# Patient Record
Sex: Female | Born: 1955 | Race: White | Hispanic: No | Marital: Married | Smoking: Never smoker
Health system: Southern US, Community
[De-identification: ages and names within clinical notes are randomized; demographics above are authoritative.]

## PROBLEM LIST (undated history)

## (undated) DIAGNOSIS — C801 Malignant (primary) neoplasm, unspecified: Secondary | ICD-10-CM

## (undated) HISTORY — PX: BREAST EXCISIONAL BIOPSY: SUR124

## (undated) HISTORY — PX: BREAST BIOPSY: SHX20

---

## 1999-01-30 ENCOUNTER — Other Ambulatory Visit: Admission: RE | Admit: 1999-01-30 | Discharge: 1999-01-30 | Payer: Self-pay | Admitting: Family Medicine

## 1999-12-26 ENCOUNTER — Encounter: Admission: RE | Admit: 1999-12-26 | Discharge: 1999-12-26 | Payer: Self-pay | Admitting: Family Medicine

## 1999-12-26 ENCOUNTER — Encounter: Payer: Self-pay | Admitting: Family Medicine

## 2000-02-27 ENCOUNTER — Other Ambulatory Visit: Admission: RE | Admit: 2000-02-27 | Discharge: 2000-02-27 | Payer: Self-pay | Admitting: Family Medicine

## 2005-10-23 ENCOUNTER — Ambulatory Visit: Payer: Self-pay | Admitting: Family Medicine

## 2005-11-13 ENCOUNTER — Ambulatory Visit: Payer: Self-pay | Admitting: Family Medicine

## 2005-12-09 DIAGNOSIS — E0500 Thyrotoxicosis with diffuse goiter without thyrotoxic crisis or storm: Secondary | ICD-10-CM | POA: Insufficient documentation

## 2006-09-05 ENCOUNTER — Ambulatory Visit: Payer: Self-pay | Admitting: Family Medicine

## 2007-10-29 ENCOUNTER — Ambulatory Visit: Payer: Self-pay | Admitting: Family Medicine

## 2009-05-31 ENCOUNTER — Ambulatory Visit: Payer: Self-pay

## 2010-01-01 ENCOUNTER — Ambulatory Visit: Payer: Self-pay | Admitting: Family Medicine

## 2010-01-15 ENCOUNTER — Ambulatory Visit: Payer: Self-pay | Admitting: Family Medicine

## 2013-05-26 ENCOUNTER — Ambulatory Visit: Payer: Self-pay | Admitting: Family Medicine

## 2013-05-28 ENCOUNTER — Ambulatory Visit: Payer: Self-pay | Admitting: Family Medicine

## 2014-06-03 ENCOUNTER — Ambulatory Visit: Payer: Self-pay | Admitting: Family Medicine

## 2014-07-01 ENCOUNTER — Ambulatory Visit: Payer: Self-pay | Admitting: Family Medicine

## 2014-07-07 ENCOUNTER — Ambulatory Visit: Payer: Self-pay | Admitting: Family Medicine

## 2014-07-28 ENCOUNTER — Ambulatory Visit: Payer: Self-pay | Admitting: Family Medicine

## 2014-09-16 ENCOUNTER — Ambulatory Visit: Payer: Self-pay | Admitting: Family Medicine

## 2014-10-21 ENCOUNTER — Ambulatory Visit: Payer: Self-pay | Admitting: Family Medicine

## 2014-12-16 ENCOUNTER — Ambulatory Visit: Payer: Self-pay | Admitting: Family Medicine

## 2014-12-23 LAB — CBC AND DIFFERENTIAL
HCT: 40 % (ref 36–46)
Hemoglobin: 13.8 g/dL (ref 12.0–16.0)
Neutrophils Absolute: 2 /uL
Platelets: 276 10*3/uL (ref 150–399)
WBC: 4.3 10^3/mL

## 2014-12-23 LAB — BASIC METABOLIC PANEL
BUN: 24 mg/dL — AB (ref 4–21)
Creatinine: 0.9 mg/dL (ref 0.5–1.1)
Glucose: 94 mg/dL
Potassium: 4.1 mmol/L (ref 3.4–5.3)
Sodium: 142 mmol/L (ref 137–147)

## 2014-12-23 LAB — HEPATIC FUNCTION PANEL
ALT: 11 U/L (ref 7–35)
AST: 15 U/L (ref 13–35)
Alkaline Phosphatase: 86 U/L (ref 25–125)
Bilirubin, Total: 0.5 mg/dL

## 2014-12-23 LAB — LIPID PANEL
Cholesterol: 198 mg/dL (ref 0–200)
HDL: 78 mg/dL — AB (ref 35–70)
LDL Cholesterol: 111 mg/dL
LDl/HDL Ratio: 1.4
Triglycerides: 47 mg/dL (ref 40–160)

## 2014-12-23 LAB — TSH: TSH: 0.9 u[IU]/mL (ref 0.41–5.90)

## 2015-01-20 ENCOUNTER — Ambulatory Visit: Payer: Self-pay | Admitting: Family Medicine

## 2015-01-27 ENCOUNTER — Ambulatory Visit: Payer: Self-pay | Admitting: Family Medicine

## 2015-03-24 ENCOUNTER — Ambulatory Visit
Admit: 2015-03-24 | Disposition: A | Discharge status: Home / Self Care | Payer: Self-pay | Attending: Family Medicine | Admitting: Family Medicine

## 2015-03-24 LAB — HM PAP SMEAR

## 2015-05-19 DIAGNOSIS — F4320 Adjustment disorder, unspecified: Secondary | ICD-10-CM | POA: Insufficient documentation

## 2015-05-19 DIAGNOSIS — Z124 Encounter for screening for malignant neoplasm of cervix: Secondary | ICD-10-CM | POA: Insufficient documentation

## 2015-05-19 DIAGNOSIS — J189 Pneumonia, unspecified organism: Secondary | ICD-10-CM | POA: Insufficient documentation

## 2015-05-19 DIAGNOSIS — Z6822 Body mass index (BMI) 22.0-22.9, adult: Secondary | ICD-10-CM | POA: Insufficient documentation

## 2015-05-19 DIAGNOSIS — IMO0002 Reserved for concepts with insufficient information to code with codable children: Secondary | ICD-10-CM | POA: Insufficient documentation

## 2015-05-22 ENCOUNTER — Encounter: Payer: Self-pay | Admitting: Family Medicine

## 2015-05-22 ENCOUNTER — Ambulatory Visit (INDEPENDENT_AMBULATORY_CARE_PROVIDER_SITE_OTHER): Payer: BLUE CROSS/BLUE SHIELD | Admitting: Family Medicine

## 2015-05-22 VITALS — BP 118/70 | HR 80 | Temp 97.7°F | Resp 16 | Ht 63.0 in | Wt 133.0 lb

## 2015-05-22 DIAGNOSIS — R938 Abnormal findings on diagnostic imaging of other specified body structures: Secondary | ICD-10-CM

## 2015-05-22 DIAGNOSIS — R9389 Abnormal findings on diagnostic imaging of other specified body structures: Secondary | ICD-10-CM

## 2015-05-22 DIAGNOSIS — M7522 Bicipital tendinitis, left shoulder: Secondary | ICD-10-CM | POA: Diagnosis not present

## 2015-05-22 DIAGNOSIS — J189 Pneumonia, unspecified organism: Secondary | ICD-10-CM

## 2015-05-22 MED ORDER — MELOXICAM 15 MG PO TABS: 15.0000 mg | ORAL_TABLET | Freq: Every day | ORAL | Status: DC

## 2015-05-22 NOTE — Progress Notes (Signed)
   Subjective:    Patient ID: Bonnie Campbell, female    DOB: November 16, 1956, 59 y.o.   MRN: [id number]  Arm Pain  There was no injury mechanism. The pain is present in the upper right arm. The quality of the pain is described as aching (on and off; worse with movement). The pain does not radiate. The pain is mild. The pain has been improving since the incident.  Patient reports that today she feels much better. Patient reports that she took OTC Aleve, pt reports last dose Saturday night. Patient reports that pain was worse on Thursday and Friday.  Works as a Educational psychologist.   Was severe on Friday. Really was with specific movements. ROM no longer limited.     Review of Systems  Constitutional: Negative.   Respiratory: Negative.   Cardiovascular: Negative.   Musculoskeletal: Positive for myalgias.  Neurological: Negative for headaches.  Psychiatric/Behavioral: Negative.     History reviewed. No pertinent past medical history. Past Surgical History  Procedure Laterality Date  . Breast biopsy Bilateral     Multiple times    reports that she has never smoked. She has never used smokeless tobacco. She reports that she does not drink alcohol or use illicit drugs. family history includes Breast cancer in her paternal grandmother; Diabetes Mellitus II in her mother; Kidney cancer in her father; Lung cancer in her father; Prostate cancer in her father. Allergies  Allergen Reactions  . Augmentin  [Amoxicillin-Pot Clavulanate]   . Codeine   . Hydrocodone-Acetaminophen Nausea Only   Patient Active Problem List   Diagnosis Date Noted  . Adaptation reaction 05/19/2015  . Body mass index (BMI) of 22.0-22.9 in adult 05/19/2015  . Encounter for screening for malignant neoplasm of cervix 05/19/2015  . 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 PNA (pneumonia) 05/19/2015  . 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 Adaptation reaction 05/19/2015  . Body mass index (BMI) of 22.0-22.9 in adult 05/19/2015  . Encounter for screening for malignant neoplasm of cervix 05/19/2015  . 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 PNA (pneumonia) 05/19/2015  . 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 calcium carbonate (TUMS) 500 MG chewable tablet Chew 1 tablet by mouth as needed.    . Naproxen Sodium (ALEVE) 220 MG CAPS Take 1 tablet by mouth as needed.     No current facility-administered medications on file prior to visit.        Objective:   Physical Exam  Constitutional: She appears well-developed and well-nourished.  Cardiovascular: Normal rate and regular rhythm.   Pulmonary/Chest: Effort normal and breath sounds normal.  Musculoskeletal:  Tender in right bicep.     Blood pressure 118/70, pulse 80, temperature 97.7 F (36.5 C), temperature source Oral, resp. rate 16, height 5\' 3"  (1.6 m), weight 133 lb (60.328 kg).       Assessment & Plan:   1. Biceps tendonitis on left Improving. Mobic as needed. Please call back if condition worsens or does not continue to improve.     2. Pneumonia, organism unspecified  Recheck CXR.  - DG Chest 2 View; Future  3. Abnormal CXR  Stable.

## 2015-05-26 ENCOUNTER — Ambulatory Visit
Admission: RE | Admit: 2015-05-26 | Discharge: 2015-05-26 | Disposition: A | Discharge status: Home / Self Care | Payer: BLUE CROSS/BLUE SHIELD | Source: Ambulatory Visit | Attending: Family Medicine | Admitting: Family Medicine

## 2015-05-26 ENCOUNTER — Telehealth: Payer: Self-pay

## 2015-05-26 DIAGNOSIS — J189 Pneumonia, unspecified organism: Secondary | ICD-10-CM | POA: Insufficient documentation

## 2015-05-26 DIAGNOSIS — R938 Abnormal findings on diagnostic imaging of other specified body structures: Secondary | ICD-10-CM | POA: Insufficient documentation

## 2015-05-26 DIAGNOSIS — R9389 Abnormal findings on diagnostic imaging of other specified body structures: Secondary | ICD-10-CM

## 2015-05-26 NOTE — Telephone Encounter (Signed)
LMTCB 05/26/2015  Thanks,   -Mickel Baas

## 2015-05-26 NOTE — Telephone Encounter (Signed)
-----   Message from Margarita Rana, MD sent at 05/26/2015  2:02 PM EDT ----- Normal CXR. Please notify patient. Thanks.

## 2015-05-26 NOTE — Telephone Encounter (Signed)
Pt advised as directed below.   Thanks,   -Vernestine Brodhead  

## 2015-12-24 IMAGING — CT CT CHEST W/O CM
2 of 3 series · 15 of 36 positions shown, 18 images · non-contrast
Comparison: Chest radiographs 01/20/2015 and earlier.

CLINICAL DATA: 58-year-old female with recurrent pneumonia. Cough
and congestion worsening x2 weeks. Subsequent encounter.

EXAM:
CT CHEST WITHOUT CONTRAST
TECHNIQUE: Multidetector CT imaging of the chest was performed following the
standard protocol without IV contrast..

[Series 2: routine chest wo · axial · 0.63mm/px · z∈[-618,-354]mm · 12 of 63 slices shown, 15 images]
[im 5/63  mediastinal]
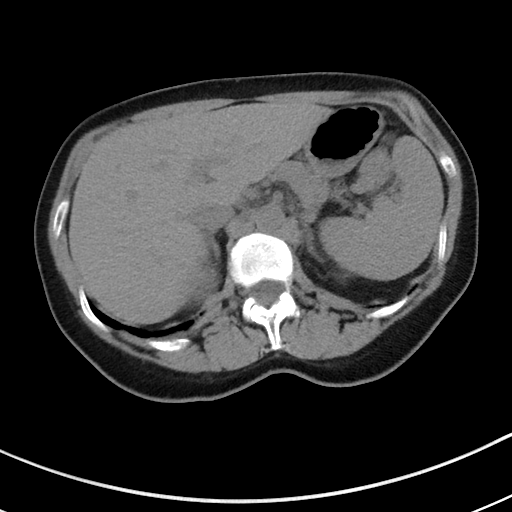
[im 5/63  lung]
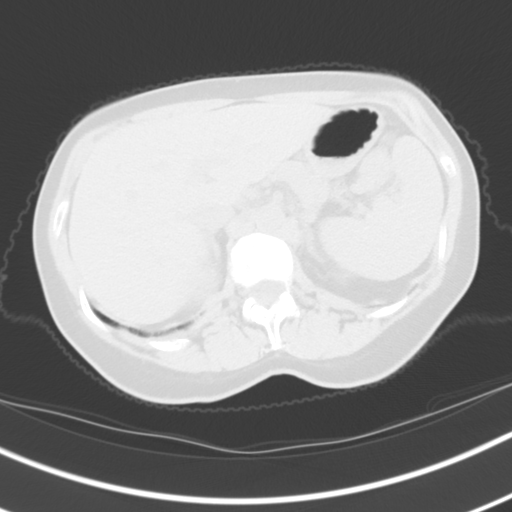
[im 10/63  lung]
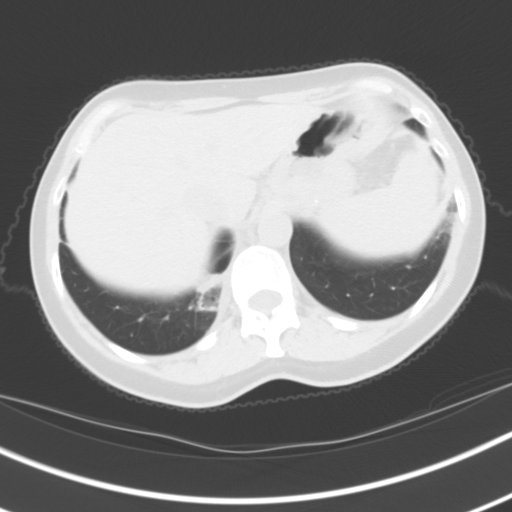
[im 14/63  lung]
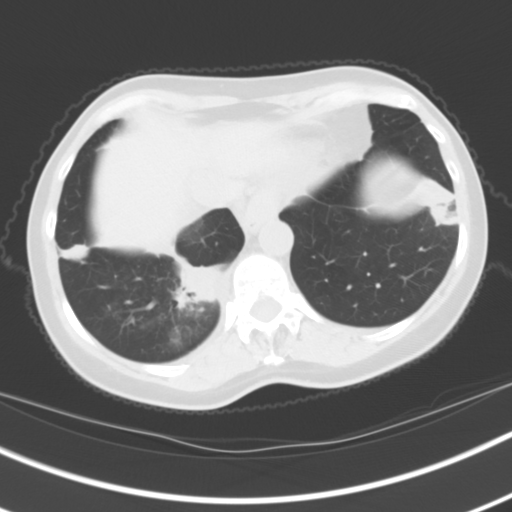
[im 19/63  lung]
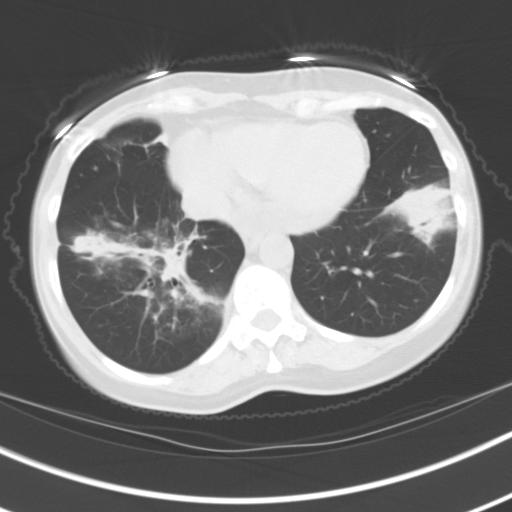
[im 23/63  mediastinal]
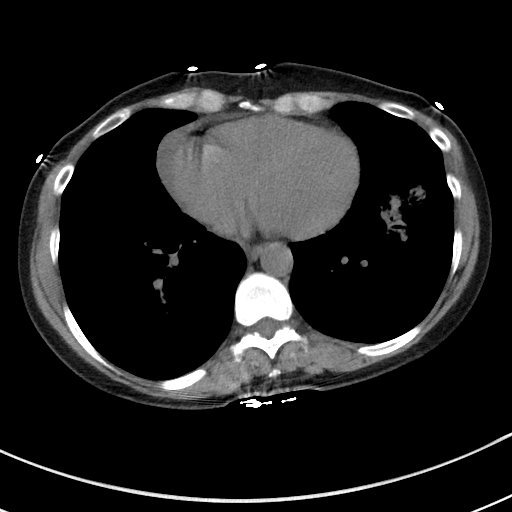
[im 23/63  lung]
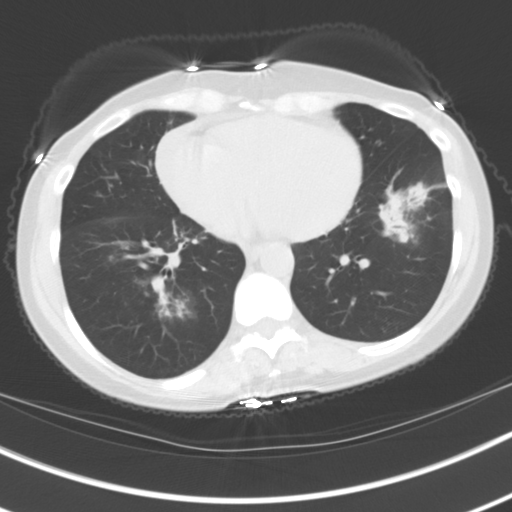
[im 28/63  lung]
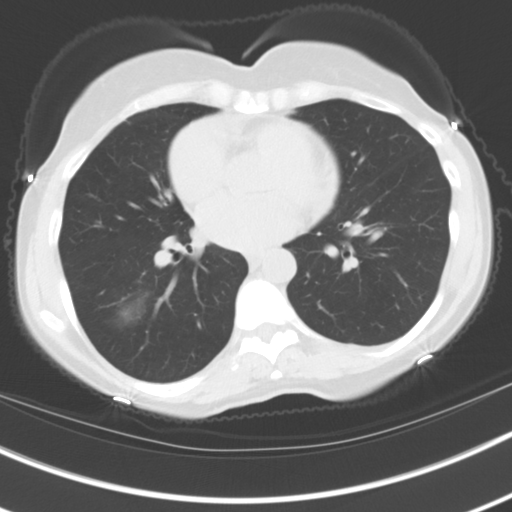
[im 35/63  lung]
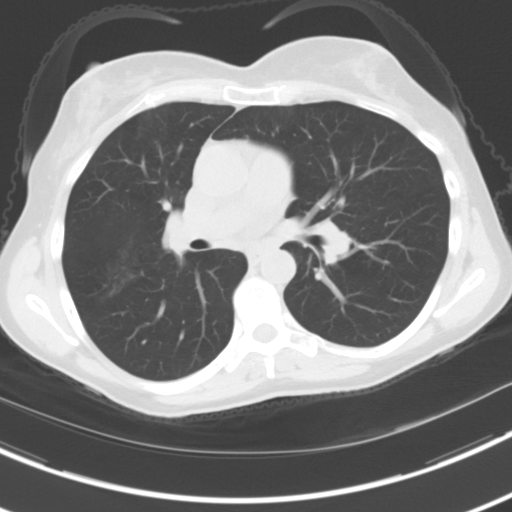
[im 40/63  lung]
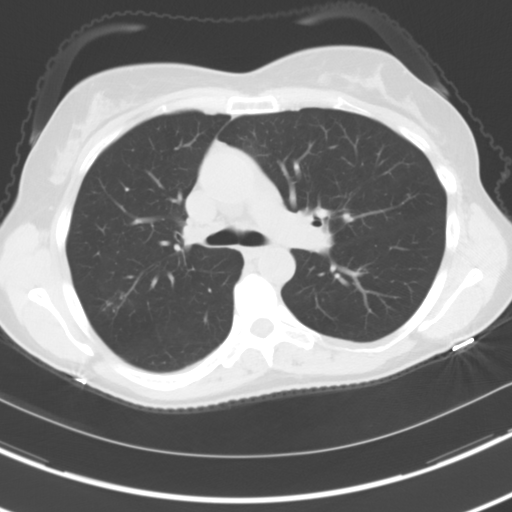
[im 44/63  mediastinal]
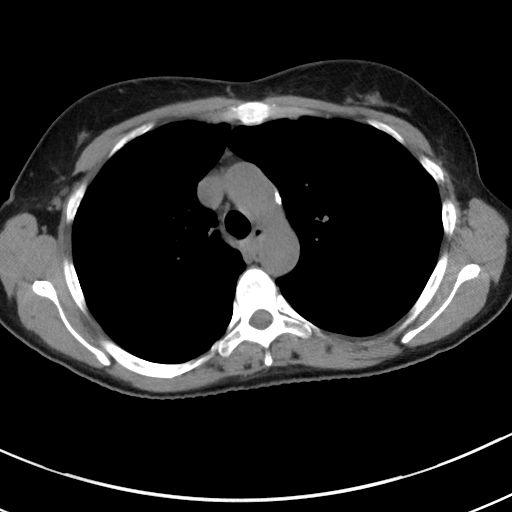
[im 44/63  lung]
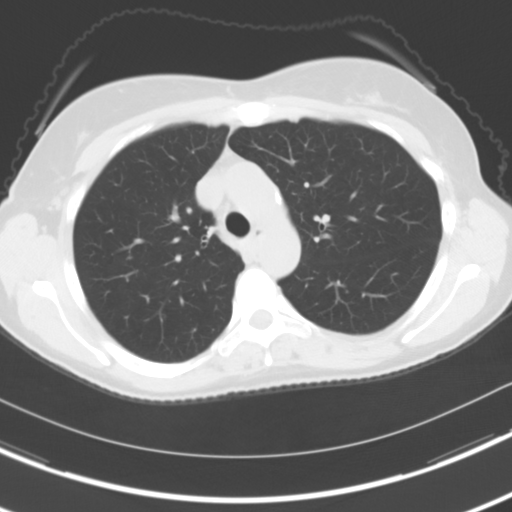
[im 49/63  lung]
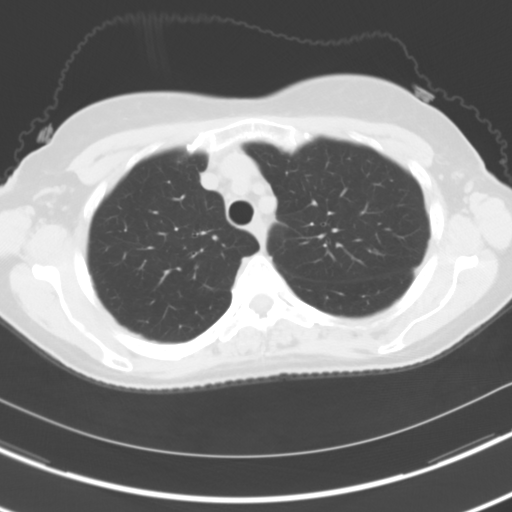
[im 53/63  lung]
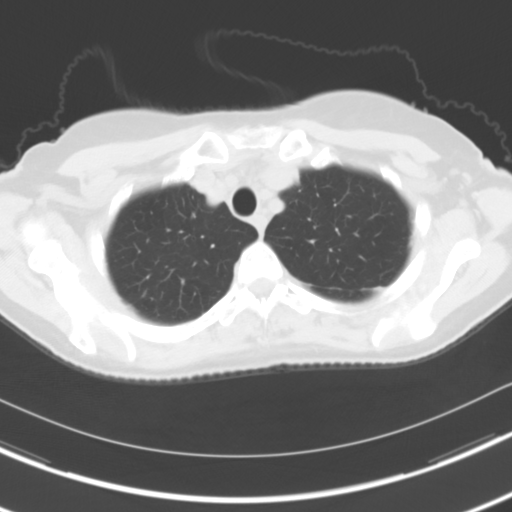
[im 58/63  lung]
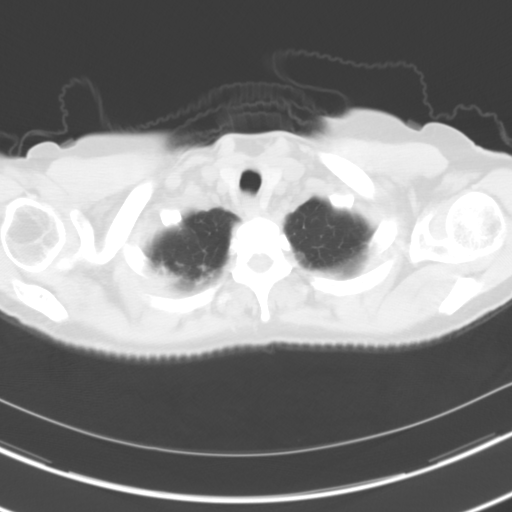

[Series 5: cor routine chest wo · coronal · 0.63mm/px · 3 of 121 slices shown]
[im 25/121  lung]
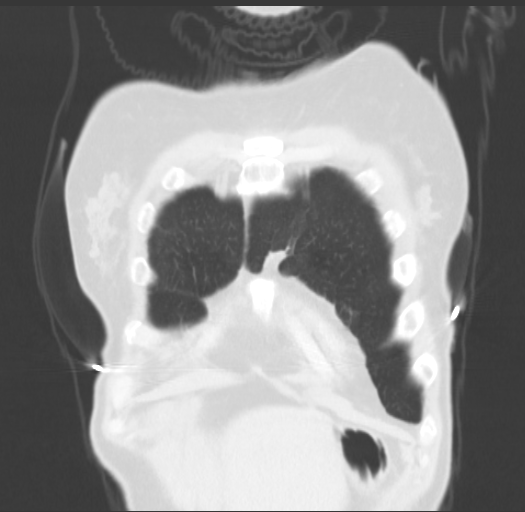
[im 49/121  lung]
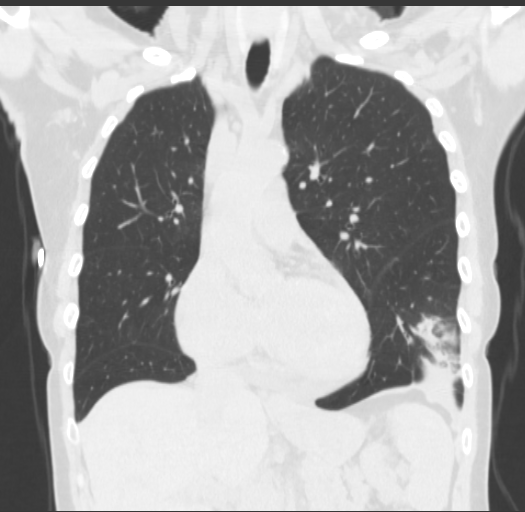
[im 73/121  lung]
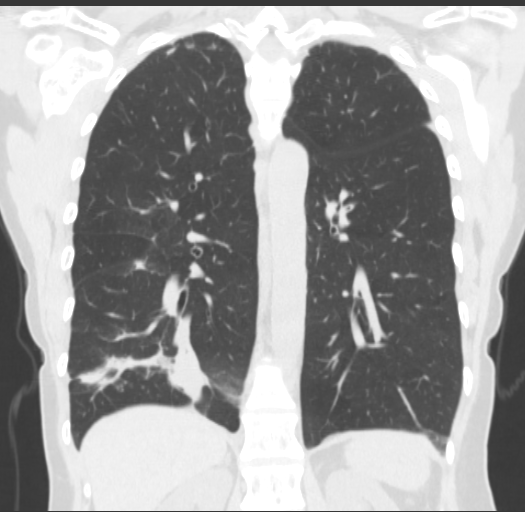

[15 of 36 positions shown; findings below may reference images not displayed]

FINDINGS: Central airways are patent. There are multifocal bilateral lower
lobe confluent areas of airspace disease and consolidation with air
bronchograms. Surrounding tree-in-bud nodularity in the right lower
lobe which is more extensively affected. Mild if any associated
lower lobe bronchiectasis. Early tree-in-bud nodularity in the
inferior aspect of the right upper lobe. similar changes also in the
medial segment of the right middle lobe. Minimal distal
peribronchovascular nodularity in the lingula. The left upper lobe
is spared.

Trace right pleural effusion. No pericardial effusion. No
mediastinal or hilar lymphadenopathy.

Negative non contrast thoracic inlet. No axillary lymphadenopathy.
Negative visualized non contrast liver, spleen, pancreas, adrenal
glands, kidneys, and bowel in the upper abdomen.

No acute osseous abnormality identified.
IMPRESSION: Bilateral multilobar bronchopneumonia, right lower lobe most
affected. Trace right pleural effusion.

Again, recommend post treatment radiographs to document resolution.

## 2016-04-21 IMAGING — CR DG CHEST 2V
1 series · 2 of 2 positions shown · non-contrast
Comparison: 03/24/2015.

CLINICAL DATA: Pneumonia.

EXAM:
CHEST  2 VIEW

[Series 1: dg chest 2 view · 0.14mm/px · 2 of 2 slices shown]
[im 1/2]
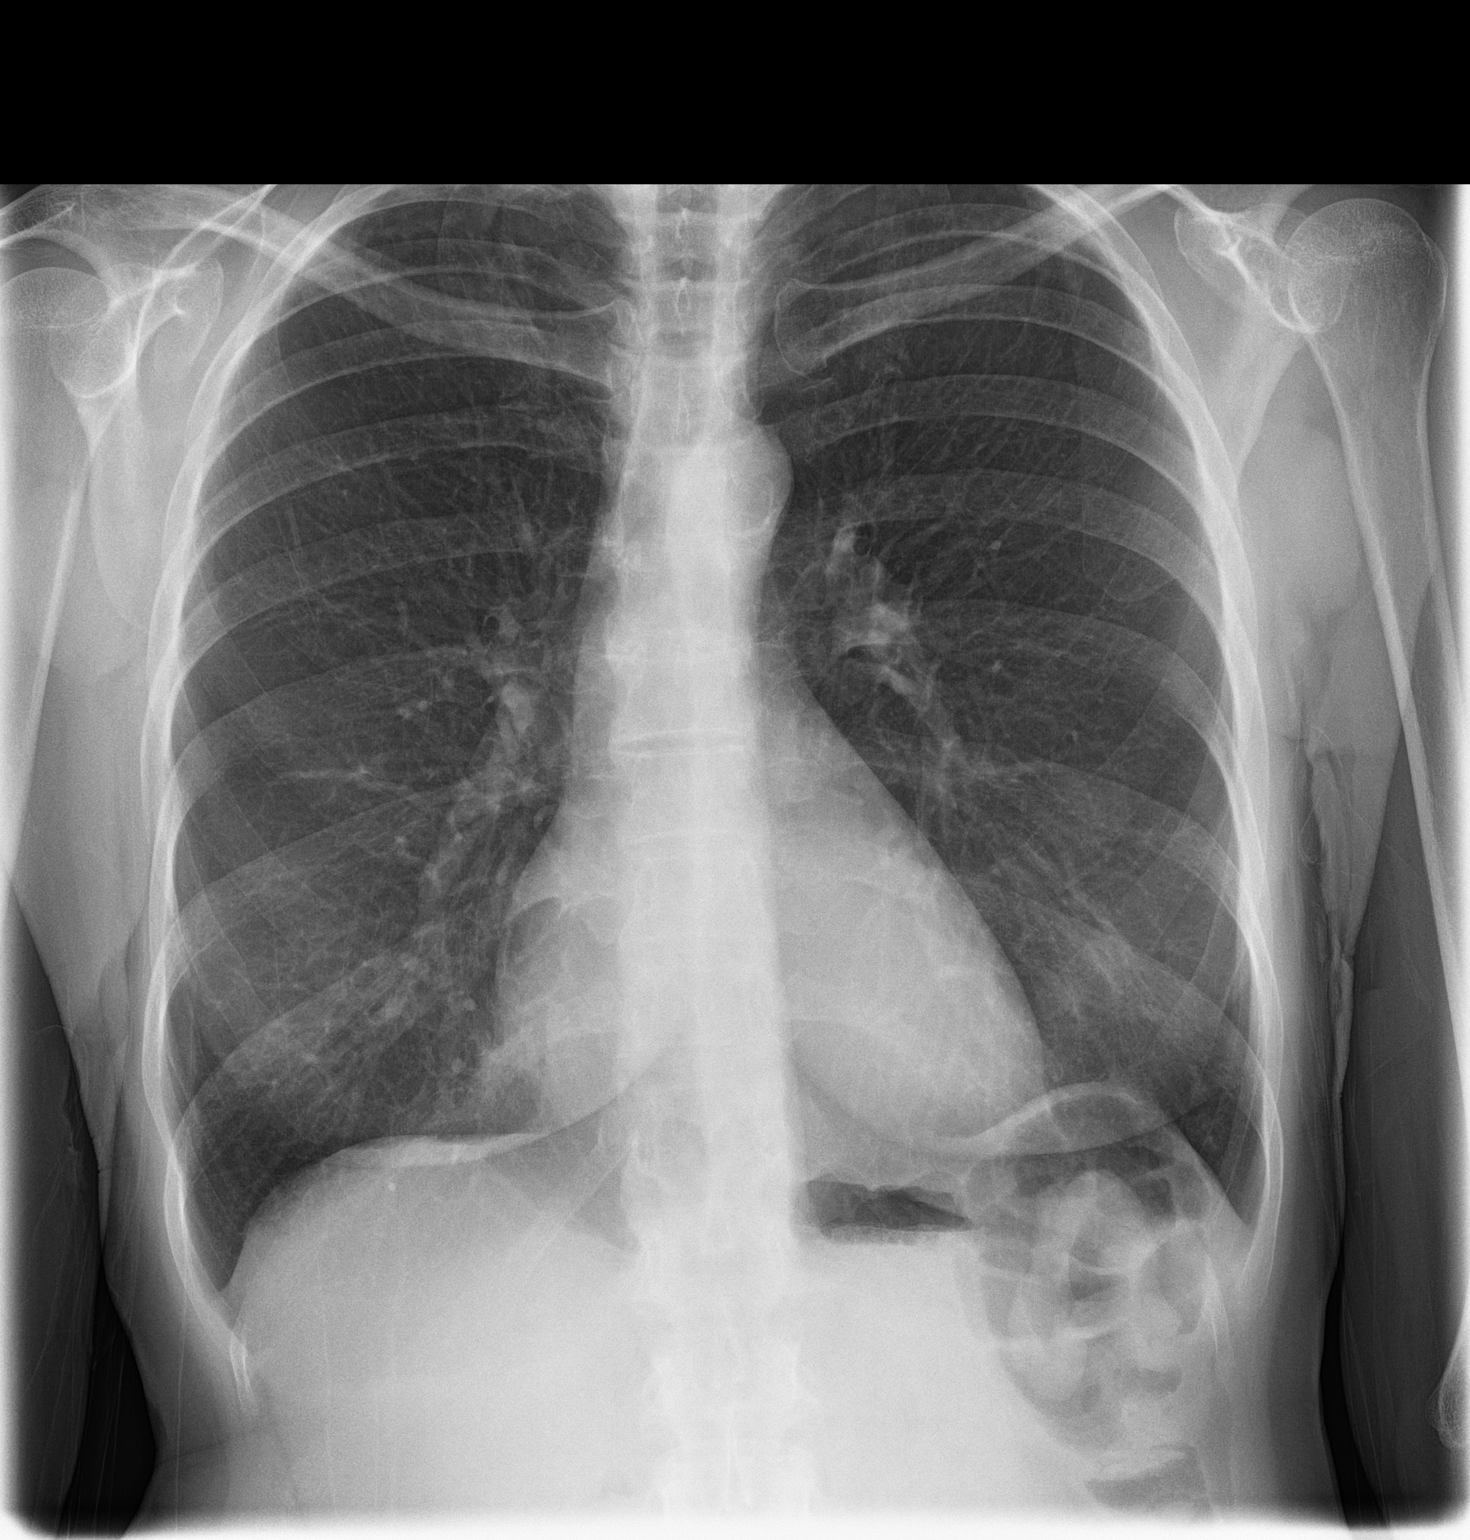
[im 2/2]
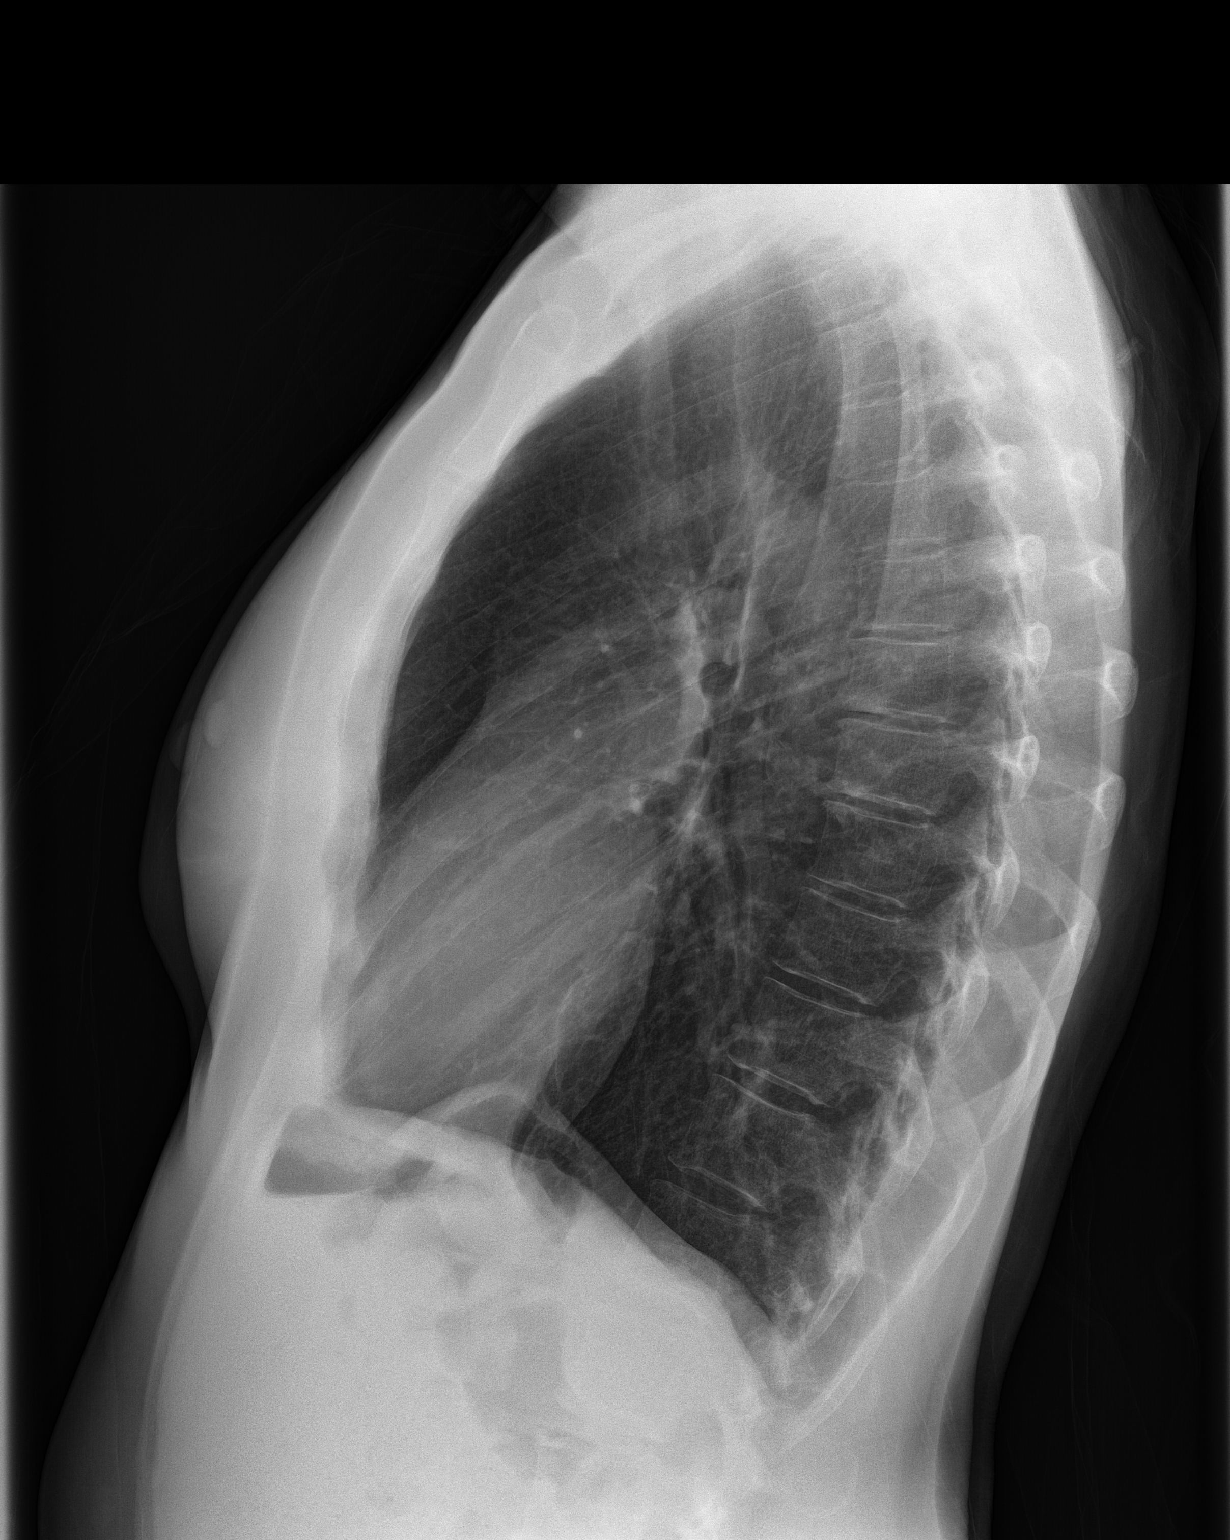

[2 of 2 positions shown; findings below may reference images not displayed]

FINDINGS: Mediastinum hilar structures are normal. No acute infiltrate. No
pleural effusion or pneumothorax. Biapical pleural parenchymal
thickening noted consistent scarring. Heart size normal. No acute
bony abnormality.
IMPRESSION: No acute cardiopulmonary disease.

## 2016-05-17 ENCOUNTER — Ambulatory Visit (INDEPENDENT_AMBULATORY_CARE_PROVIDER_SITE_OTHER): Payer: BLUE CROSS/BLUE SHIELD | Admitting: Family Medicine

## 2016-05-17 ENCOUNTER — Encounter: Payer: Self-pay | Admitting: Family Medicine

## 2016-05-17 VITALS — BP 110/70 | HR 76 | Temp 97.8°F | Resp 16 | Ht 62.5 in | Wt 134.0 lb

## 2016-05-17 DIAGNOSIS — B977 Papillomavirus as the cause of diseases classified elsewhere: Secondary | ICD-10-CM | POA: Diagnosis not present

## 2016-05-17 DIAGNOSIS — R8789 Other abnormal findings in specimens from female genital organs: Secondary | ICD-10-CM

## 2016-05-17 DIAGNOSIS — Z1239 Encounter for other screening for malignant neoplasm of breast: Secondary | ICD-10-CM

## 2016-05-17 DIAGNOSIS — M7661 Achilles tendinitis, right leg: Secondary | ICD-10-CM | POA: Diagnosis not present

## 2016-05-17 DIAGNOSIS — IMO0002 Reserved for concepts with insufficient information to code with codable children: Secondary | ICD-10-CM

## 2016-05-17 DIAGNOSIS — Z0000 Encounter for general adult medical examination without abnormal findings: Secondary | ICD-10-CM

## 2016-05-17 NOTE — Progress Notes (Signed)
Patient ID: ZARIYHA CHOLICO, female   DOB: 1956-04-21, 60 y.o.   MRN: [id number]       Patient: Bonnie Campbell, Female    DOB: [birth year], 60 y.o.   MRN: [id number] Visit Date: 05/17/2016  Today's Provider: Margarita Rana, MD   Chief Complaint  Patient presents with  . Annual Exam   Subjective:    Annual physical exam Bonnie Campbell is a 60 y.o. female who presents today for health maintenance and complete physical. She feels well. She reports exercising active with daily activites. She reports she is sleeping fairly well.  12/16/14 CPE 03/24/15 Pap-neg; 12/16/14 Pap-still HPV+ 07/28/14 Mammogram-BI-RADS 2  -----------------------------------------------------------------   Review of Systems  Constitutional: Negative.   HENT: Negative.   Eyes: Negative.   Respiratory: Negative.   Cardiovascular: Negative.   Gastrointestinal: Negative.   Endocrine: Negative.   Genitourinary: Negative.   Musculoskeletal: Negative.   Skin: Negative.   Allergic/Immunologic: Negative.   Neurological: Negative.   Hematological: Negative.   Psychiatric/Behavioral: Negative.     Social History      She  reports that she has never smoked. She has never used smokeless tobacco. She reports that she does not drink alcohol or use illicit drugs.       Social History   Social History  . Marital Status: Married    Spouse Name: N/A  . Number of Children: 1  . Years of Education: N/A   Occupational History  .      [place] History Main Topics  . Smoking status: Never Smoker   . Smokeless tobacco: Never Used  . Alcohol Use: No  . Drug Use: No  . Sexual Activity: Not Asked   Other Topics Concern  . None   Social History Narrative    History reviewed. No pertinent past medical history.   Patient Active Problem List   Diagnosis Date Noted  . Biceps tendonitis on left 05/22/2015  . Abnormal CXR 05/22/2015  . Adaptation reaction 05/19/2015  . Body mass index  (BMI) of 22.0-22.9 in adult 05/19/2015  . Encounter for screening for malignant neoplasm of cervix 05/19/2015  . 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 PNA (pneumonia) 05/19/2015  . 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 Breast biopsy Bilateral     Multiple times    Family History        Family Status  Relation Status Death Age  . Mother Alive   . Father Deceased 4    liver cancer  . Sister Alive   . Sister Alive         Her family history includes Breast cancer in her paternal grandmother; Diabetes Mellitus II in her mother; Kidney cancer in her father; Lung cancer in her father; Prostate cancer in her father.    Allergies  Allergen Reactions  . Augmentin  [Amoxicillin-Pot Clavulanate]   . Codeine   . Hydrocodone-Acetaminophen Nausea Only    No outpatient prescriptions have been marked as taking for the 05/17/16 encounter (Office Visit) with Margarita Rana, MD.    Patient Care Team: Margarita Rana, MD as PCP - General (Family Medicine)     Objective:   Vitals: BP 110/70 mmHg  Pulse 76  Temp(Src) 97.8 F (36.6 C) (Oral)  Resp 16  Ht 5' 2.5" (1.588 m)  Wt 134 lb (60.782 kg)  BMI 24.10 kg/m2   Physical Exam  Constitutional: She is oriented to person, place, and time. She appears well-developed and well-nourished.  HENT:  Head: Normocephalic and atraumatic.  Right Ear: Tympanic membrane, external ear and ear canal normal.  Left Ear: Tympanic membrane, external ear and ear canal normal.  Nose: Nose normal.  Mouth/Throat: Uvula is midline, oropharynx is clear and moist and mucous membranes are normal.  Eyes: Conjunctivae, EOM and lids are normal. Pupils are equal, round, and reactive to light.  Neck: Trachea normal and normal range of motion. Neck supple. Carotid bruit is not present. No thyroid mass and no thyromegaly  present.  Cardiovascular: Normal rate, regular rhythm and normal heart sounds.   Pulmonary/Chest: Effort normal and breath sounds normal.  Abdominal: Soft. Normal appearance and bowel sounds are normal. There is no hepatosplenomegaly. There is no tenderness.  Genitourinary: Vagina normal. No breast swelling, tenderness or discharge.  Musculoskeletal: Normal range of motion.  Lymphadenopathy:    She has no cervical adenopathy.    She has no axillary adenopathy.  Neurological: She is alert and oriented to person, place, and time. She has normal strength. No cranial nerve deficit.  Skin: Skin is warm, dry and intact.  Psychiatric: She has a normal mood and affect. Her speech is normal and behavior is normal. Judgment and thought content normal. Cognition and memory are normal.     Depression Screen PHQ 2/9 Scores 05/17/2016  PHQ - 2 Score 0      Assessment & Plan:     Routine Health Maintenance and Physical Exam  Exercise Activities and Dietary recommendations Goals    None      Immunization History  Administered Date(s) Administered  . Tdap 12/16/2014        1. Annual physical exam Stable. Patient advised to continue eating healthy and exercise daily. - POCT urinalysis dipstick - CBC with Differential/Platelet - Comprehensive metabolic panel - Lipid Panel With LDL/HDL Ratio - TSH  2. 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 F/U pending lab report. - Pap IG and HPV (high risk) DNA detection  3. Breast cancer screening - MM DIGITAL SCREENING BILATERAL; Future  4. Achilles tendinitis of right lower extremity New problem. Patient referred to podiatry for evaluation and plan of care. - Ambulatory referral to Podiatry   Patient seen and examined by Dr. Jerrell Belfast, and note scribed by Philbert Riser. Dimas, CMA.  I have reviewed the document for accuracy and completeness and I agree with above. Jerrell Belfast, MD   Margarita Rana, MD     --------------------------------------------------------------------

## 2016-05-17 NOTE — Patient Instructions (Signed)
Please call the [facility] at [facility] to schedule this at [phone]   

## 2016-05-21 ENCOUNTER — Telehealth: Payer: Self-pay

## 2016-05-21 LAB — PAP IG AND HPV HIGH-RISK
HPV, high-risk: POSITIVE — AB
PAP Smear Comment: 0

## 2016-05-21 NOTE — Telephone Encounter (Signed)
lmtcb Meiling Hendriks Drozdowski, CMA  

## 2016-05-21 NOTE — Telephone Encounter (Signed)
-----   Message from Margarita Rana, MD sent at 05/21/2016  2:39 PM EDT ----- Normal PAP. Still HPV positive. Needs to continue yearly PAPs. Thanks.

## 2016-05-22 NOTE — Telephone Encounter (Signed)
Advised pt of lab results. Pt verbally acknowledges understanding. Emily Drozdowski, CMA   

## 2016-05-24 ENCOUNTER — Telehealth: Payer: Self-pay

## 2016-05-24 NOTE — Telephone Encounter (Signed)
Patient would like to hear back about her referral to Dr Milinda Pointer today, referral was put in last week., please call her today .-aa

## 2016-05-24 NOTE — Telephone Encounter (Signed)
Pt given Dr Stephenie Acres contact information

## 2016-05-24 NOTE — Telephone Encounter (Signed)
Pt will let patient know that [facility] suppose to call pt with appt information-aa

## 2016-05-25 LAB — COMPREHENSIVE METABOLIC PANEL
ALT: 10 IU/L (ref 0–32)
AST: 20 IU/L (ref 0–40)
Albumin/Globulin Ratio: 2 (ref 1.2–2.2)
Albumin: 4.3 g/dL (ref 3.6–4.8)
Alkaline Phosphatase: 77 IU/L (ref 39–117)
BUN/Creatinine Ratio: 26 (ref 12–28)
BUN: 20 mg/dL (ref 8–27)
Bilirubin Total: 0.4 mg/dL (ref 0.0–1.2)
CALCIUM: 8.7 mg/dL (ref 8.7–10.3)
CO2: 20 mmol/L (ref 18–29)
Chloride: 101 mmol/L (ref 96–106)
Creatinine, Ser: 0.76 mg/dL (ref 0.57–1.00)
GFR, EST AFRICAN AMERICAN: 99 mL/min/{1.73_m2} (ref >59)
GFR, EST NON AFRICAN AMERICAN: 86 mL/min/{1.73_m2} (ref >59)
GLUCOSE: 88 mg/dL (ref 65–99)
Globulin, Total: 2.1 g/dL (ref 1.5–4.5)
Potassium: 4.3 mmol/L (ref 3.5–5.2)
Sodium: 140 mmol/L (ref 134–144)
Total Protein: 6.4 g/dL (ref 6.0–8.5)

## 2016-05-25 LAB — CBC WITH DIFFERENTIAL/PLATELET
BASOS ABS: 0 10*3/uL (ref 0.0–0.2)
BASOS: 1 %
EOS (ABSOLUTE): 0.2 10*3/uL (ref 0.0–0.4)
Eos: 4 %
Hematocrit: 35.5 % (ref 34.0–46.6)
Hemoglobin: 12.3 g/dL (ref 11.1–15.9)
IMMATURE GRANULOCYTES: 0 %
Immature Grans (Abs): 0 10*3/uL (ref 0.0–0.1)
LYMPHS: 38 %
Lymphocytes Absolute: 1.7 10*3/uL (ref 0.7–3.1)
MCH: 29.9 pg (ref 26.6–33.0)
MCHC: 34.6 g/dL (ref 31.5–35.7)
MCV: 86 fL (ref 79–97)
Monocytes Absolute: 0.3 10*3/uL (ref 0.1–0.9)
Monocytes: 7 %
NEUTROS PCT: 50 %
Neutrophils Absolute: 2.3 10*3/uL (ref 1.4–7.0)
PLATELETS: 228 10*3/uL (ref 150–379)
RBC: 4.12 x10E6/uL (ref 3.77–5.28)
RDW: 12.5 % (ref 12.3–15.4)
WBC: 4.5 10*3/uL (ref 3.4–10.8)

## 2016-05-25 LAB — LIPID PANEL WITH LDL/HDL RATIO
Cholesterol, Total: 170 mg/dL (ref 100–199)
HDL: 65 mg/dL (ref >39)
LDL Calculated: 98 mg/dL (ref 0–99)
LDL/HDL RATIO: 1.5 ratio (ref 0.0–3.2)
TRIGLYCERIDES: 36 mg/dL (ref 0–149)
VLDL CHOLESTEROL CAL: 7 mg/dL (ref 5–40)

## 2016-05-25 LAB — TSH: TSH: 0.63 u[IU]/mL (ref 0.450–4.500)

## 2016-05-27 ENCOUNTER — Telehealth: Payer: Self-pay

## 2016-05-27 NOTE — Telephone Encounter (Signed)
Pt advised.   Thanks,   -Laura  

## 2016-05-27 NOTE — Telephone Encounter (Signed)
-----   Message from Margarita Rana, MD sent at 05/25/2016  6:25 AM EDT ----- Labs stable. Please notify patient. Thanks.

## 2016-05-27 NOTE — Telephone Encounter (Signed)
LMTCB 05/27/2016  Thanks,   -Mickel Baas

## 2016-06-04 ENCOUNTER — Ambulatory Visit
Admission: RE | Admit: 2016-06-04 | Discharge: 2016-06-04 | Disposition: A | Discharge status: Home / Self Care | Payer: BLUE CROSS/BLUE SHIELD | Source: Ambulatory Visit | Attending: Family Medicine | Admitting: Family Medicine

## 2016-06-04 ENCOUNTER — Other Ambulatory Visit: Payer: Self-pay | Admitting: Family Medicine

## 2016-06-04 DIAGNOSIS — Z1231 Encounter for screening mammogram for malignant neoplasm of breast: Secondary | ICD-10-CM | POA: Insufficient documentation

## 2016-06-04 DIAGNOSIS — Z1239 Encounter for other screening for malignant neoplasm of breast: Secondary | ICD-10-CM | POA: Insufficient documentation

## 2016-06-25 ENCOUNTER — Ambulatory Visit: Payer: Self-pay | Admitting: Podiatry

## 2016-07-04 ENCOUNTER — Encounter: Payer: Self-pay | Admitting: Podiatry

## 2016-07-04 ENCOUNTER — Ambulatory Visit (INDEPENDENT_AMBULATORY_CARE_PROVIDER_SITE_OTHER): Payer: BLUE CROSS/BLUE SHIELD | Admitting: Podiatry

## 2016-07-04 ENCOUNTER — Ambulatory Visit (INDEPENDENT_AMBULATORY_CARE_PROVIDER_SITE_OTHER): Payer: BLUE CROSS/BLUE SHIELD

## 2016-07-04 VITALS — BP 108/99 | HR 72 | Resp 12

## 2016-07-04 DIAGNOSIS — R52 Pain, unspecified: Secondary | ICD-10-CM | POA: Diagnosis not present

## 2016-07-04 DIAGNOSIS — M7661 Achilles tendinitis, right leg: Secondary | ICD-10-CM

## 2016-07-04 MED ORDER — MELOXICAM 15 MG PO TABS: 15.0000 mg | ORAL_TABLET | Freq: Every day | ORAL | 2 refills | Status: DC

## 2016-07-04 NOTE — Patient Instructions (Signed)

## 2016-07-04 NOTE — Progress Notes (Signed)
   Subjective:    Patient ID: Bonnie Campbell, female    DOB: Feb 04, 1956, 60 y.o.   MRN: [id number]  HPI Chief Complaint  Patient presents with  . Plantar Fasciitis    RT BOTTOM OF THE HEEL IS BEEN PAINFUL FOR 3 MONTH. FOOT IS WORSE FIRST STEP IN THE MORNING. TRIED OTC INSERTS-NO RELIEF.   60 year old female presents the office they for concerns of pain to the right heel and she states his low back of the heel which is been ongoing for about 3 months. She states that she has pain when she first gets up after periods of rest which is relieved by activity. She's tried over-the-counter answers without any relief. She denies any recent injury or trauma. No swelling or redness. No numbness or tingling. No other complaints at this time.   Review of Systems  Musculoskeletal: Positive for gait problem.       Objective:   Physical Exam General: AAO x3, NAD  Dermatological: Skin is warm, dry and supple bilateral. Nails x 10 are well manicured; remaining integument appears unremarkable at this time. There are no open sores, no preulcerative lesions, no rash or signs of infection present.  Vascular: Dorsalis Pedis artery and Posterior Tibial artery pedal pulses are 2/4 bilateral with immedate capillary fill time. Pedal hair growth present. There is no pain with calf compression, swelling, warmth, erythema.   Neruologic: Grossly intact via light touch bilateral. Vibratory intact via tuning fork bilateral. Protective threshold with Semmes Wienstein monofilament intact to all pedal sites bilateral.   Musculoskeletal: There is tenderness on the insertion of the Achilles tendon on the right posterior heel. Thompson test is negative. Tendon appears to be intact. There is no edema, erythema, increase in warmth. Equinus is present. No pain lateral compression of calcaneus. No other areas of tenderness bilaterally. Muscular strength 5/5 in all groups tested bilateral.  Gait: Unassisted, Nonantalgic.       Assessment & Plan:  60 year old female with right Achilles tendinitis, heel pain -Treatment options discussed including all alternatives, risks, and complications -Etiology of symptoms were discussed -X-rays were obtained and reviewed with the patient.  -Discussed stretching, icing exercises daily. Dispensed night splint. Discussed that her shoe gear modifications. Offloading pad was also dispensed. Heel lifts. Follow-up in 4 weeks or sooner if needed. Call any questions or concerns in the meantime.  Celesta Gentile, DPM

## 2016-08-06 ENCOUNTER — Ambulatory Visit (INDEPENDENT_AMBULATORY_CARE_PROVIDER_SITE_OTHER): Payer: BLUE CROSS/BLUE SHIELD | Admitting: Podiatry

## 2016-08-06 ENCOUNTER — Encounter: Payer: Self-pay | Admitting: Podiatry

## 2016-08-06 DIAGNOSIS — M7661 Achilles tendinitis, right leg: Secondary | ICD-10-CM

## 2016-08-07 NOTE — Progress Notes (Signed)
Subjective: 60 year old female presents the office for follow-up evaluation of right posterior heel pain. She states the pain is substantially improved compared to what was last appointment. She has very minimal discomfort at this time. She has been stretching icing intimately to when the night splint as well as unit offloading pad which has helped quite a bit. Denies any swelling or redness. Denies any systemic complaints such as fevers, chills, nausea, vomiting. No acute changes since last appointment, and no other complaints at this time.   Objective: AAO x3, NAD DP/PT pulses palpable bilaterally, CRT less than 3 seconds Whole posterior heel spurs present with mild tenderness on the lateral aspect. This appears to be greatly improved the last clinic. There is no tenderness along the Achilles tendon. Thompson's is negative. There is no overlying edema, erythema, increase in warmth. No edema, erythema, increase in warmth to bilateral lower extremities.  No open lesions or pre-ulcerative lesions.  No pain with calf compression, swelling, warmth, erythema  Assessment: Resolving Achilles tendinitis, bursitis  Plan: -All treatment options discussed with the patient including all alternatives, risks, complications.  -Continue stretching, icing exercises daily. Continue with night splint. Discussed shoe modifications attending offloading. Follow-up in 4 weeks if symptoms continue or sooner if any questions are to arise. -Patient encouraged to call the office with any questions, concerns, change in symptoms.   Celesta Gentile, DPM

## 2017-06-24 ENCOUNTER — Other Ambulatory Visit: Payer: Self-pay | Admitting: Family Medicine

## 2017-06-24 DIAGNOSIS — Z1231 Encounter for screening mammogram for malignant neoplasm of breast: Secondary | ICD-10-CM

## 2017-08-13 ENCOUNTER — Ambulatory Visit
Admission: RE | Admit: 2017-08-13 | Discharge: 2017-08-13 | Disposition: A | Discharge status: Home / Self Care | Payer: BLUE CROSS/BLUE SHIELD | Source: Ambulatory Visit | Attending: Family Medicine | Admitting: Family Medicine

## 2017-08-13 DIAGNOSIS — Z1231 Encounter for screening mammogram for malignant neoplasm of breast: Secondary | ICD-10-CM | POA: Insufficient documentation

## 2017-08-13 HISTORY — DX: Malignant (primary) neoplasm, unspecified: C80.1

## 2017-08-20 ENCOUNTER — Other Ambulatory Visit: Payer: Self-pay | Admitting: Family Medicine

## 2017-08-20 DIAGNOSIS — R928 Other abnormal and inconclusive findings on diagnostic imaging of breast: Secondary | ICD-10-CM

## 2017-08-20 DIAGNOSIS — N6310 Unspecified lump in the right breast, unspecified quadrant: Secondary | ICD-10-CM

## 2017-09-05 ENCOUNTER — Ambulatory Visit
Admission: RE | Admit: 2017-09-05 | Discharge: 2017-09-05 | Disposition: A | Discharge status: Home / Self Care | Payer: BLUE CROSS/BLUE SHIELD | Source: Ambulatory Visit | Attending: Family Medicine | Admitting: Family Medicine

## 2017-09-05 DIAGNOSIS — N6310 Unspecified lump in the right breast, unspecified quadrant: Secondary | ICD-10-CM

## 2017-09-05 DIAGNOSIS — R928 Other abnormal and inconclusive findings on diagnostic imaging of breast: Secondary | ICD-10-CM

## 2020-03-02 ENCOUNTER — Ambulatory Visit: Payer: BLUE CROSS/BLUE SHIELD | Attending: Internal Medicine

## 2020-03-02 DIAGNOSIS — Z23 Encounter for immunization: Secondary | ICD-10-CM

## 2020-03-02 NOTE — Progress Notes (Signed)
   Covid-19 Vaccination Clinic  Name:  [name]    MRN: [id number] DOB: [birth year]  03/02/2020  Ms. Bonnie Campbell was observed post Covid-19 immunization for 15 minutes without incident. She was provided with Vaccine Information Sheet and instruction to access the V-Safe system.   Ms. Bonnie Campbell was instructed to call 911 with any severe reactions post vaccine: Marland Kitchen Difficulty breathing  . Swelling of face and throat  . A fast heartbeat  . A bad rash all over body  . Dizziness and weakness   Immunizations Administered    Name Date Dose VIS Date Route   Pfizer COVID-19 Vaccine 03/02/2020  9:48 AM 0.3 mL 11/19/2019 Intramuscular   Manufacturer: [place]   Lot: [id number]   [place]: [id number]

## 2020-03-27 ENCOUNTER — Ambulatory Visit: Payer: BLUE CROSS/BLUE SHIELD | Attending: Internal Medicine

## 2020-03-27 DIAGNOSIS — Z23 Encounter for immunization: Secondary | ICD-10-CM

## 2020-03-27 NOTE — Progress Notes (Signed)
   Covid-19 Vaccination Clinic  Name:  Bonnie Campbell    MRN: [id number] DOB: 01-13-1956  03/27/2020  Bonnie Campbell was observed post Covid-19 immunization for 15 minutes without incident. She was provided with Vaccine Information Sheet and instruction to access the V-Safe system.   Bonnie Campbell was instructed to call 911 with any severe reactions post vaccine: Marland Kitchen Difficulty breathing  . Swelling of face and throat  . A fast heartbeat  . A bad rash all over body  . Dizziness and weakness   Immunizations Administered    Name Date Dose VIS Date Route   Pfizer COVID-19 Vaccine 03/27/2020  9:23 AM 0.3 mL 02/02/2019 Intramuscular   Manufacturer: [place]   Lot: [id number]   [place]: [id number]

## 2022-01-14 ENCOUNTER — Other Ambulatory Visit: Payer: Self-pay | Admitting: Registered Nurse

## 2022-01-14 DIAGNOSIS — R87810 Cervical high risk human papillomavirus (HPV) DNA test positive: Secondary | ICD-10-CM | POA: Diagnosis not present

## 2022-01-14 DIAGNOSIS — I83813 Varicose veins of bilateral lower extremities with pain: Secondary | ICD-10-CM | POA: Diagnosis not present

## 2022-01-14 DIAGNOSIS — Z0000 Encounter for general adult medical examination without abnormal findings: Secondary | ICD-10-CM | POA: Diagnosis not present

## 2022-01-14 DIAGNOSIS — E038 Other specified hypothyroidism: Secondary | ICD-10-CM | POA: Diagnosis not present

## 2022-01-14 DIAGNOSIS — Z1151 Encounter for screening for human papillomavirus (HPV): Secondary | ICD-10-CM | POA: Diagnosis not present

## 2022-01-14 DIAGNOSIS — R87610 Atypical squamous cells of undetermined significance on cytologic smear of cervix (ASC-US): Secondary | ICD-10-CM | POA: Diagnosis not present

## 2022-01-14 DIAGNOSIS — M810 Age-related osteoporosis without current pathological fracture: Secondary | ICD-10-CM | POA: Diagnosis not present

## 2022-01-14 DIAGNOSIS — C44519 Basal cell carcinoma of skin of other part of trunk: Secondary | ICD-10-CM | POA: Diagnosis not present

## 2022-01-14 DIAGNOSIS — Z01419 Encounter for gynecological examination (general) (routine) without abnormal findings: Secondary | ICD-10-CM | POA: Diagnosis not present

## 2022-01-14 DIAGNOSIS — Z780 Asymptomatic menopausal state: Secondary | ICD-10-CM | POA: Diagnosis not present

## 2022-01-14 DIAGNOSIS — Z1231 Encounter for screening mammogram for malignant neoplasm of breast: Secondary | ICD-10-CM

## 2022-01-14 DIAGNOSIS — Z1212 Encounter for screening for malignant neoplasm of rectum: Secondary | ICD-10-CM | POA: Diagnosis not present

## 2022-01-15 ENCOUNTER — Ambulatory Visit
Admission: RE | Admit: 2022-01-15 | Discharge: 2022-01-15 | Disposition: A | Discharge status: Home / Self Care | Payer: Medicare HMO | Source: Ambulatory Visit | Attending: Registered Nurse | Admitting: Registered Nurse

## 2022-01-15 DIAGNOSIS — Z1231 Encounter for screening mammogram for malignant neoplasm of breast: Secondary | ICD-10-CM | POA: Diagnosis not present

## 2022-01-15 DIAGNOSIS — E038 Other specified hypothyroidism: Secondary | ICD-10-CM | POA: Diagnosis not present

## 2022-01-15 DIAGNOSIS — Z0000 Encounter for general adult medical examination without abnormal findings: Secondary | ICD-10-CM | POA: Diagnosis not present

## 2022-01-29 DIAGNOSIS — Z0000 Encounter for general adult medical examination without abnormal findings: Secondary | ICD-10-CM | POA: Diagnosis not present

## 2022-01-29 DIAGNOSIS — M810 Age-related osteoporosis without current pathological fracture: Secondary | ICD-10-CM | POA: Diagnosis not present

## 2022-01-30 DIAGNOSIS — R87610 Atypical squamous cells of undetermined significance on cytologic smear of cervix (ASC-US): Secondary | ICD-10-CM | POA: Diagnosis not present

## 2022-01-30 DIAGNOSIS — N72 Inflammatory disease of cervix uteri: Secondary | ICD-10-CM | POA: Diagnosis not present

## 2022-03-10 ENCOUNTER — Other Ambulatory Visit: Payer: Self-pay

## 2022-03-10 DIAGNOSIS — I83819 Varicose veins of unspecified lower extremities with pain: Secondary | ICD-10-CM

## 2022-03-19 ENCOUNTER — Ambulatory Visit (HOSPITAL_COMMUNITY)
Admission: RE | Admit: 2022-03-19 | Discharge: 2022-03-19 | Disposition: A | Discharge status: Home / Self Care | Payer: Medicare HMO | Source: Ambulatory Visit | Attending: Vascular Surgery | Admitting: Vascular Surgery

## 2022-03-19 ENCOUNTER — Ambulatory Visit: Payer: Medicare HMO | Admitting: Vascular Surgery

## 2022-03-19 ENCOUNTER — Encounter: Payer: Self-pay | Admitting: Vascular Surgery

## 2022-03-19 DIAGNOSIS — I83819 Varicose veins of unspecified lower extremities with pain: Secondary | ICD-10-CM | POA: Insufficient documentation

## 2022-03-19 DIAGNOSIS — I872 Venous insufficiency (chronic) (peripheral): Secondary | ICD-10-CM

## 2022-03-19 DIAGNOSIS — I8393 Asymptomatic varicose veins of bilateral lower extremities: Secondary | ICD-10-CM

## 2022-03-19 NOTE — Progress Notes (Signed)
? ? ?Patient name: Bonnie Campbell MRN: [id number] DOB: [birth year] Sex: female ? ?REASON FOR CONSULT: Painful varicosities  ? ?HPI: ?Bonnie Campbell is a 66 y.o. female, that presents for evaluation of painful varicosities particularly worse in the right leg.  She states these have been present for years.  She has had what sounds like bilateral great saphenous vein stripping's done about 40 years ago.  Most recently sounds like she had stab phlebectomies done around 8 to 9 years ago.  She really does not remember the name of the practice and thinks the doctor is no longer in practice.  She has worn compression stockings in the past but not currently wearing compression stockings.  She states the varicosity in her right thigh limits her ability to exercise and is painful throughout the day. ? ?Past Medical History:  ?Diagnosis Date  ? 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 ? ?Past Surgical History:  ?Procedure Laterality Date  ? BREAST BIOPSY Bilateral yrs ago  ? Multiple times  ? ? ?Family History  ?Problem Relation Age of Onset  ? Diabetes Mellitus II Mother   ? Prostate cancer Father   ? Lung cancer Father   ? Kidney cancer Father   ? Breast cancer Paternal Grandmother   ? ? ?SOCIAL HISTORY: ?Social History  ? ?Socioeconomic History  ? Marital status: Married  ?  Spouse name: Not on file  ? Number of children: 1  ? Years of education: Not on file  ? Highest education level: Not on file  ?Occupational History  ?  Comment: [employer]  ?Tobacco Use  ? Smoking status: Never  ? Smokeless tobacco: Never  ?Vaping Use  ? Vaping Use: Never used  ?Substance and Sexual Activity  ? Alcohol use: No  ?  Alcohol/week: 0.0 standard drinks  ? Drug use: No  ? Sexual activity: Not on file  ?Other Topics Concern  ? Not on file  ?Social History Narrative  ? Not on file  ? ?Social Determinants of Health  ? ?Financial Resource Strain: Not on file  ?Food Insecurity: Not on file  ?Transportation Needs: Not on file  ?Physical Activity: Not  on file  ?Stress: Not on file  ?Social Connections: Not on file  ?Intimate Partner Violence: Not on file  ? ? ?Allergies  ?Allergen Reactions  ? Augmentin  [Amoxicillin-Pot Clavulanate]   ? Codeine   ? Hydrocodone-Acetaminophen Nausea Only  ? ? ?Current Outpatient Medications  ?Medication Sig Dispense Refill  ? alendronate (FOSAMAX) 70 MG tablet Take by mouth.    ? ?No current facility-administered medications for this visit.  ? ? ?REVIEW OF SYSTEMS:  ?'[X]'$  denotes positive finding, '[ ]'$  denotes negative finding ?Cardiac  Comments:  ?Chest pain or chest pressure:    ?Shortness of breath upon exertion:    ?Short of breath when lying flat:    ?Irregular heart rhythm:    ?    ?Vascular    ?Pain in calf, thigh, or hip brought on by ambulation:    ?Pain in feet at night that wakes you up from your sleep:     ?Blood clot in your veins:    ?Leg swelling:     ?    ?Pulmonary    ?Oxygen at home:    ?Productive cough:     ?Wheezing:     ?    ?Neurologic    ?Sudden weakness in arms or legs:     ?Sudden numbness in arms or  legs:     ?Sudden onset of difficulty speaking or slurred speech:    ?Temporary loss of vision in one eye:     ?Problems with dizziness:     ?    ?Gastrointestinal    ?Blood in stool:     ?Vomited blood:     ?    ?Genitourinary    ?Burning when urinating:     ?Blood in urine:    ?    ?Psychiatric    ?Major depression:     ?    ?Hematologic    ?Bleeding problems:    ?Problems with blood clotting too easily:    ?    ?Skin    ?Rashes or ulcers:    ?    ?Constitutional    ?Fever or chills:    ? ? ?PHYSICAL EXAM: ?There were no vitals filed for this visit. ? ?GENERAL: The patient is a well-nourished female, in no acute distress. The vital signs are documented above. ?CARDIAC: There is a regular rate and rhythm.  ?VASCULAR:  ?Bilateral femoral pulses palpable ?Bilateral PT pulses palpable ?Large varicosity in the right thigh as pictured below that is a source of significant discomfort during the day ?PULMONARY: No  respiratory distress. ?ABDOMEN: Soft and non-tender. ?MUSCULOSKELETAL: There are no major deformities or cyanosis. ?NEUROLOGIC: No focal weakness or paresthesias are detected. ?SKIN: There are no ulcers or rashes noted. ?PSYCHIATRIC: The patient has a normal affect. ? ?  ? ? ?DATA:  ? ?Lower Venous Reflux Study  ? ?Patient Name:  Bonnie Campbell  Date of Exam:   03/19/2022  ?Medical Rec #: [id number]        Accession #:    [id number]  ?Date of Birth: [birth year]        Patient Gender: F  ?Patient Age:   17 years  ?Exam Location:  [facility]  ?Procedure:      VAS [place] LOWER EXTREMITY VENOUS REFLUX  ?Referring Phys: Monica Martinez  ? ? ?---------------------------------------------------------------------------  ?-----  ?   ?Indications: Varicosities, and Pain.  ?Other Indications: History of bilateral vein procedures.  ? ?Performing Technologist: Alvia Grove RVT  ? ?   ?Examination Guidelines: A complete evaluation includes B-mode imaging,  ?spectral  ?Doppler, color Doppler, and power Doppler as needed of all accessible  ?portions  ?of each vessel. Bilateral testing is considered an integral part of a  ?complete  ?examination. Limited examinations for reoccurring indications may be  ?performed  ?as noted. The reflux portion of the exam is performed with the patient in  ?reverse Trendelenburg.  ?Significant venous reflux is defined as >500 ms in the superficial venous  ?system, and >1 second in the deep venous system.  ? ?   ?Venous Reflux Times  ?+--------------+---------+------+-----------+------------+----------------+  ? ?RIGHT         Reflux NoRefluxReflux TimeDiameter cmsComments          ?  ?                        Yes                                           ?  ?+--------------+---------+------+-----------+------------+----------------+  ? ?CFV                     yes   >1 second                               ?   ?+--------------+---------+------+-----------+------------+----------------+  ? ?  FV mid        no                                                      ?  ?+--------------+---------+------+-----------+------------+----------------+  ? ?Popliteal               yes   >1 second                               ?  ?+--------------+---------+------+-----------+------------+----------------+  ? ?GSV at [facility]              yes    >500 ms      0.49                      ?  ?+--------------+---------+------+-----------+------------+----------------+  ? ?GSV prox thigh                                      NV  ?/tortuosities  ?+--------------+---------+------+-----------+------------+----------------+  ? ?GSV mid thigh                                       NV                ?  ?+--------------+---------+------+-----------+------------+----------------+  ? ?GSV dist thigh                                      NV                ?  ?+--------------+---------+------+-----------+------------+----------------+  ? ?GSV at knee             yes    >500 ms      0.44                      ?  ?+--------------+---------+------+-----------+------------+----------------+  ? ?GSV prox calf                                       NV                ?  ?+--------------+---------+------+-----------+------------+----------------+  ? ?GSV mid calf                                        NV                ?  ?+--------------+---------+------+-----------+------------+----------------+  ? ?SSV Pop Fossa no                            0.20                      ?  ?+--------------+---------+------+-----------+------------+----------------+  ? ?SSV prox calf no  0.26                      ?  ?+--------------+---------+------+-----------+------------+----------------+  ? ?SSV mid calf            yes    >500 ms      0.40                      ?   ?+--------------+---------+------+-----------+------------+----------------+  ? ?AASV                                                NV                ?  ?+--------------+---------+------+-----------+------------+----------------+  ? ? ?   ?+--------------+---------+------+-----------+------------+--------+  ?LEFT          Reflux NoRefluxReflux TimeDiameter cmsComments  ?

## 2022-06-26 ENCOUNTER — Ambulatory Visit: Payer: Medicare HMO | Admitting: Vascular Surgery

## 2022-06-26 ENCOUNTER — Encounter: Payer: Self-pay | Admitting: Vascular Surgery

## 2022-06-26 VITALS — BP 146/72 | HR 67 | Temp 98.1°F | Resp 18 | Ht 62.0 in | Wt 124.2 lb

## 2022-06-26 DIAGNOSIS — I872 Venous insufficiency (chronic) (peripheral): Secondary | ICD-10-CM | POA: Diagnosis not present

## 2022-06-26 DIAGNOSIS — I83813 Varicose veins of bilateral lower extremities with pain: Secondary | ICD-10-CM | POA: Diagnosis not present

## 2022-06-26 NOTE — Progress Notes (Signed)
REASON FOR VISIT:   Follow-up of chronic venous insufficiency  MEDICAL ISSUES:   CHRONIC VENOUS INSUFFICIENCY: This patient has CEAP C4 venous disease.  Her symptoms are more significant on the right side where she has dilated varicose veins.  She is undergone previous stripping of both great saphenous veins.  She does have deep venous reflux bilaterally but no significant superficial venous reflux.  She has failed conservative treatment including thigh-high compression stockings, elevation, and exercise.  I think she would be a candidate for greater than 20 stab phlebectomies of the right leg.  We have discussed the indications for the procedure and the potential complications and she would like to proceed given the severity of her symptoms.  In addition, I have discussed with her again the conservative measures including the importance of daily leg elevation and trying to avoid prolonged sitting and standing.  She does have a healthy weight.   HPI:   Bonnie Campbell is a pleasant 66 y.o. female who was seen by Dr. Carlis Abbott on 03/19/2022 with painful varicose veins bilaterally.  Her symptoms were more significant on the right side.  They have been present for years.  She has had bilateral great saphenous vein stripping's in the remote past.  She also had stab phlebectomies around 8 or 9 years ago.  She feels that her symptoms are limiting her ability to exercise and also work as she stands for long hours.  She was prescribed thigh-high compression stockings with a gradient of 20 to 30 mmHg, encouraged to elevate her legs, and exercise.  She comes in for 40-monthfollow-up visit.  Since she was seen last she continues to have significant aching pain and heaviness in the right lower extremity which is aggravated by sitting standing and exercise.  She gets disabling pain in the right leg especially with exercise and she does remain fairly active.  She works as a wEducational psychologistalso 2 days a week and her  symptoms are especially bad after a long shift.  She has been elevating her legs and this does temporarily relieve her symptoms.  She has also been wearing her thigh-high compression stockings with a gradient of 20 to 30 mmHg which helps.  She also exercises.  She denies any previous history of DVT.  Past Medical History:  Diagnosis Date   [facility]    [facility]    Family History  Problem Relation Age of Onset   Diabetes Mellitus II Mother    Prostate cancer Father    Lung cancer Father    Kidney cancer Father    Breast cancer Paternal Grandmother     SOCIAL HISTORY: Social History   Tobacco Use   Smoking status: Never   Smokeless tobacco: Never  Substance Use Topics   Alcohol use: No    Alcohol/week: 0.0 standard drinks of alcohol    Allergies  Allergen Reactions   Augmentin  [Amoxicillin-Pot Clavulanate]    Codeine    Hydrocodone-Acetaminophen Nausea Only    Current Outpatient Medications  Medication Sig Dispense Refill   alendronate (FOSAMAX) 70 MG tablet Take by mouth.     No current facility-administered medications for this visit.    REVIEW OF SYSTEMS:  '[X]'$  denotes positive finding, '[ ]'$  denotes negative finding Cardiac  Comments:  Chest pain or chest pressure:    Shortness of breath upon exertion:    Short of breath when lying flat:    Irregular heart rhythm:        Vascular  Pain in calf, thigh, or hip brought on by ambulation:    Pain in feet at night that wakes you up from your sleep:     Blood clot in your veins:    Leg swelling:  x       Pulmonary    Oxygen at home:    Productive cough:     Wheezing:         Neurologic    Sudden weakness in arms or legs:     Sudden numbness in arms or legs:     Sudden onset of difficulty speaking or slurred speech:    Temporary loss of vision in one eye:     Problems with dizziness:         Gastrointestinal    Blood in stool:     Vomited blood:         Genitourinary    Burning when urinating:      Blood in urine:        Psychiatric    Major depression:         Hematologic    Bleeding problems:    Problems with blood clotting too easily:        Skin    Rashes or ulcers:        Constitutional    Fever or chills:     PHYSICAL EXAM:   Vitals:   06/26/22 1302  BP: (!) 146/72  Pulse: 67  Resp: 18  Temp: 98.1 F (36.7 C)  TempSrc: Temporal  SpO2: 100%  Weight: 124 lb 3.2 oz (56.3 kg)  Height: '5\' 2"'$  (1.575 m)    GENERAL: The patient is a well-nourished female, in no acute distress. The vital signs are documented above. CARDIAC: There is a regular rate and rhythm.  VASCULAR: I do not detect carotid bruits. She has palpable dorsalis pedis and posterior tibial pulses bilaterally. She has some dilated varicose veins in her medial right thigh and posterior right leg as documented in the photographs below.    She does have hyperpigmentation bilaterally consistent with chronic venous insufficiency.  PULMONARY: There is good air exchange bilaterally without wheezing or rales. ABDOMEN: Soft and non-tender with normal pitched bowel sounds.  MUSCULOSKELETAL: There are no major deformities or cyanosis. NEUROLOGIC: No focal weakness or paresthesias are detected. SKIN: There are no ulcers or rashes noted. PSYCHIATRIC: The patient has a normal affect.  DATA:    VENOUS DUPLEX: I have reviewed the venous duplex scan from 03/19/2022.  On the right side there was no evidence of DVT.  There was deep venous reflux involving the common femoral vein and popliteal vein.  There was really no significant superficial venous reflux noted.     On the left side, there was no evidence of DVT.  There is deep venous reflux in the common femoral vein.  There was minimal superficial venous reflux.  There was some in the proximal great saphenous vein however the vein was not dilated.  There was some and a tortuous small saphenous vein but not at the saphenous popliteal  junction.  LEFT:    Deitra Mayo [facility] [phone]

## 2022-07-25 ENCOUNTER — Other Ambulatory Visit: Payer: Self-pay | Admitting: *Deleted

## 2022-07-25 MED ORDER — LORAZEPAM 1 MG PO TABS: ORAL_TABLET | ORAL | 0 refills | Status: AC

## 2022-07-30 DIAGNOSIS — L309 Dermatitis, unspecified: Secondary | ICD-10-CM | POA: Diagnosis not present

## 2022-07-30 DIAGNOSIS — I8392 Asymptomatic varicose veins of left lower extremity: Secondary | ICD-10-CM | POA: Diagnosis not present

## 2022-07-30 DIAGNOSIS — L988 Other specified disorders of the skin and subcutaneous tissue: Secondary | ICD-10-CM | POA: Diagnosis not present

## 2022-07-30 DIAGNOSIS — L570 Actinic keratosis: Secondary | ICD-10-CM | POA: Diagnosis not present

## 2022-07-30 DIAGNOSIS — D1801 Hemangioma of skin and subcutaneous tissue: Secondary | ICD-10-CM | POA: Diagnosis not present

## 2022-07-30 DIAGNOSIS — I8391 Asymptomatic varicose veins of right lower extremity: Secondary | ICD-10-CM | POA: Diagnosis not present

## 2022-07-30 DIAGNOSIS — D485 Neoplasm of uncertain behavior of skin: Secondary | ICD-10-CM | POA: Diagnosis not present

## 2022-07-30 DIAGNOSIS — Z85828 Personal history of other malignant neoplasm of skin: Secondary | ICD-10-CM | POA: Diagnosis not present

## 2022-07-31 ENCOUNTER — Encounter: Payer: Self-pay | Admitting: Vascular Surgery

## 2022-07-31 ENCOUNTER — Ambulatory Visit: Payer: Medicare HMO | Admitting: Vascular Surgery

## 2022-07-31 VITALS — BP 124/67 | HR 63 | Temp 97.9°F | Resp 18 | Ht 62.0 in | Wt 122.5 lb

## 2022-07-31 DIAGNOSIS — I83813 Varicose veins of bilateral lower extremities with pain: Secondary | ICD-10-CM | POA: Diagnosis not present

## 2022-07-31 HISTORY — PX: OTHER SURGICAL HISTORY: SHX169

## 2022-07-31 NOTE — Progress Notes (Signed)
    Stab Phlebectomy Procedure  CHEVELLA PEARCE DOB:11/02/1956  07/31/2022  Consent signed: Yes  Surgeon:C. Scot Dock, MD  Procedure: stab phlebectomy: right leg  BP 124/67 (BP Location: Left Arm, Patient Position: Sitting, Cuff Size: Normal)   Pulse 63   Temp 97.9 F (36.6 C) (Temporal)   Resp 18   Ht '5\' 2"'$  (1.575 m)   Wt 122 lb 8 oz (55.6 kg)   SpO2 100%   BMI 22.41 kg/m   Start time: 11:15 AM   End time: 12:10 PM    Tumescent Anesthesia: 450 cc 0.9% NaCl with 50 cc Lidocaine HCL with 1% Epi and 15 cc 8.4% NaHCO3  Local Anesthesia: 8 cc Lidocaine HCL and NaHCO3 (ratio 2:1)    Stab Phlebectomy: >20  Sites: Thigh and Calf  Patient tolerated procedure well: Yes  Notes: Mrs. Weideman took Ativan 1 mg on 07-31-2022 at 9:45 AM.  All staff members wore facial masks.    Description of Procedure:  After marking the course of the secondary varicosities, the patient was placed on the operating table in the supine position, and the right leg was prepped and draped in sterile fashion.    The patient was then put into Trendelenburg position.  Local anesthetic was administered at the previously marked varicosities, and tumescent anesthesia was administered around the vessels.  Greater than 20 stab wounds were made using the tip of an 11 blade. And using the vein hook, the phlebectomies were performed using a hemostat to avulse the varicosities.  Adequate hemostasis was achieved, and steri strips were applied to the stab wound.      ABD pads and thigh high compression stockings were applied as well ace wraps where needed. Blood loss was less than 15 cc.  The patient ambulated out of the operating room having tolerated the procedure well.

## 2022-07-31 NOTE — Progress Notes (Deleted)
error 

## 2022-07-31 NOTE — Progress Notes (Signed)
   Patient name: Bonnie Campbell MRN: [id number] DOB: 1955/12/13 Sex: female  REASON FOR VISIT: For stab phlebectomies of the right leg  HPI: Bonnie Campbell is a 66 y.o. female with CEAP C4 venous disease.  She is undergone previous stripping of both great saphenous veins.  She does have deep venous reflux bilaterally but no significant superficial venous reflux.  She failed conservative treatment and was felt to be a good candidate for stab phlebectomies.   Current Outpatient Medications  Medication Sig Dispense Refill   alendronate (FOSAMAX) 70 MG tablet Take by mouth.     LORazepam (ATIVAN) 1 MG tablet Take 1 tablet 30 minutes prior to leaving office on day of office surgery.  Bring second tablet with you to office on day of office surgery. 2 tablet 0   No current facility-administered medications for this visit.    PHYSICAL EXAM: There were no vitals filed for this visit.  PROCEDURE: Greater than 20 stab phlebectomies  TECHNIQUE: The patient was taken to the exam room and the dilated veins were marked with the patient standing.  The patient was then placed supine.  The right leg was prepped and draped in usual sterile fashion.  All the marked areas were anesthetized with tumescent anesthesia.  I then made approximately 25 small stab incisions with an 11 blade and the vein was "brought above the skin and then grasped with a hemostat.  There were then bluntly excised.  Pressure was held for hemostasis.  Steri-Strips were applied.  A pressure dressing was applied.  The patient tolerated the procedure well.  Deitra Mayo [facility] [place] [phone]

## 2022-08-13 DIAGNOSIS — D485 Neoplasm of uncertain behavior of skin: Secondary | ICD-10-CM | POA: Diagnosis not present

## 2023-01-27 ENCOUNTER — Other Ambulatory Visit: Payer: Self-pay | Admitting: Registered Nurse

## 2023-01-27 DIAGNOSIS — Z1231 Encounter for screening mammogram for malignant neoplasm of breast: Secondary | ICD-10-CM

## 2023-01-29 DIAGNOSIS — E038 Other specified hypothyroidism: Secondary | ICD-10-CM | POA: Diagnosis not present

## 2023-01-29 DIAGNOSIS — Z0000 Encounter for general adult medical examination without abnormal findings: Secondary | ICD-10-CM | POA: Diagnosis not present

## 2023-01-29 DIAGNOSIS — M810 Age-related osteoporosis without current pathological fracture: Secondary | ICD-10-CM | POA: Diagnosis not present

## 2023-02-10 DIAGNOSIS — M810 Age-related osteoporosis without current pathological fracture: Secondary | ICD-10-CM | POA: Diagnosis not present

## 2023-02-10 DIAGNOSIS — R87810 Cervical high risk human papillomavirus (HPV) DNA test positive: Secondary | ICD-10-CM | POA: Diagnosis not present

## 2023-02-10 DIAGNOSIS — Z0000 Encounter for general adult medical examination without abnormal findings: Secondary | ICD-10-CM | POA: Diagnosis not present

## 2023-02-10 DIAGNOSIS — E038 Other specified hypothyroidism: Secondary | ICD-10-CM | POA: Diagnosis not present

## 2023-02-17 LAB — EXTERNAL GENERIC LAB PROCEDURE

## 2023-03-17 ENCOUNTER — Ambulatory Visit
Admission: RE | Admit: 2023-03-17 | Discharge: 2023-03-17 | Disposition: A | Discharge status: Home / Self Care | Payer: Medicare HMO | Source: Ambulatory Visit | Attending: Registered Nurse | Admitting: Registered Nurse

## 2023-03-17 DIAGNOSIS — Z1231 Encounter for screening mammogram for malignant neoplasm of breast: Secondary | ICD-10-CM | POA: Diagnosis not present

## 2023-03-24 DIAGNOSIS — Z124 Encounter for screening for malignant neoplasm of cervix: Secondary | ICD-10-CM | POA: Diagnosis not present

## 2023-03-24 DIAGNOSIS — Z6822 Body mass index (BMI) 22.0-22.9, adult: Secondary | ICD-10-CM | POA: Diagnosis not present

## 2023-03-24 DIAGNOSIS — Z1151 Encounter for screening for human papillomavirus (HPV): Secondary | ICD-10-CM | POA: Diagnosis not present

## 2023-04-30 DIAGNOSIS — R87810 Cervical high risk human papillomavirus (HPV) DNA test positive: Secondary | ICD-10-CM | POA: Diagnosis not present

## 2023-04-30 DIAGNOSIS — R87610 Atypical squamous cells of undetermined significance on cytologic smear of cervix (ASC-US): Secondary | ICD-10-CM | POA: Diagnosis not present

## 2023-04-30 DIAGNOSIS — N870 Mild cervical dysplasia: Secondary | ICD-10-CM | POA: Diagnosis not present

## 2024-02-26 DIAGNOSIS — M810 Age-related osteoporosis without current pathological fracture: Secondary | ICD-10-CM | POA: Diagnosis not present

## 2024-02-26 DIAGNOSIS — M8589 Other specified disorders of bone density and structure, multiple sites: Secondary | ICD-10-CM | POA: Diagnosis not present

## 2024-02-26 DIAGNOSIS — E038 Other specified hypothyroidism: Secondary | ICD-10-CM | POA: Diagnosis not present

## 2024-02-26 DIAGNOSIS — Z0000 Encounter for general adult medical examination without abnormal findings: Secondary | ICD-10-CM | POA: Diagnosis not present

## 2024-03-17 ENCOUNTER — Other Ambulatory Visit: Payer: Self-pay | Admitting: Registered Nurse

## 2024-03-17 DIAGNOSIS — Z1231 Encounter for screening mammogram for malignant neoplasm of breast: Secondary | ICD-10-CM

## 2024-03-29 DIAGNOSIS — Z1212 Encounter for screening for malignant neoplasm of rectum: Secondary | ICD-10-CM | POA: Diagnosis not present

## 2024-03-29 DIAGNOSIS — Z1211 Encounter for screening for malignant neoplasm of colon: Secondary | ICD-10-CM | POA: Diagnosis not present

## 2024-03-30 DIAGNOSIS — Z779 Other contact with and (suspected) exposures hazardous to health: Secondary | ICD-10-CM | POA: Diagnosis not present

## 2024-03-30 DIAGNOSIS — R87612 Low grade squamous intraepithelial lesion on cytologic smear of cervix (LGSIL): Secondary | ICD-10-CM | POA: Diagnosis not present

## 2024-03-30 DIAGNOSIS — Z6821 Body mass index (BMI) 21.0-21.9, adult: Secondary | ICD-10-CM | POA: Diagnosis not present

## 2024-04-04 LAB — COLOGUARD: COLOGUARD: NEGATIVE

## 2024-04-04 LAB — EXTERNAL GENERIC LAB PROCEDURE: COLOGUARD: NEGATIVE

## 2024-04-12 ENCOUNTER — Ambulatory Visit
Admission: RE | Admit: 2024-04-12 | Discharge: 2024-04-12 | Disposition: A | Discharge status: Home / Self Care | Source: Ambulatory Visit | Attending: Registered Nurse | Admitting: Registered Nurse

## 2024-04-12 DIAGNOSIS — Z1231 Encounter for screening mammogram for malignant neoplasm of breast: Secondary | ICD-10-CM

## 2024-04-15 ENCOUNTER — Other Ambulatory Visit: Payer: Self-pay | Admitting: Registered Nurse

## 2024-04-15 DIAGNOSIS — R928 Other abnormal and inconclusive findings on diagnostic imaging of breast: Secondary | ICD-10-CM

## 2024-05-04 ENCOUNTER — Ambulatory Visit

## 2024-05-04 ENCOUNTER — Ambulatory Visit
Admission: RE | Admit: 2024-05-04 | Discharge: 2024-05-04 | Disposition: A | Discharge status: Home / Self Care | Source: Ambulatory Visit | Attending: Registered Nurse | Admitting: Registered Nurse

## 2024-05-04 DIAGNOSIS — R928 Other abnormal and inconclusive findings on diagnostic imaging of breast: Secondary | ICD-10-CM

## 2024-05-11 DIAGNOSIS — R87810 Cervical high risk human papillomavirus (HPV) DNA test positive: Secondary | ICD-10-CM | POA: Diagnosis not present

## 2024-05-11 DIAGNOSIS — N72 Inflammatory disease of cervix uteri: Secondary | ICD-10-CM | POA: Diagnosis not present

## 2024-06-15 DIAGNOSIS — H9203 Otalgia, bilateral: Secondary | ICD-10-CM | POA: Diagnosis not present

## 2024-06-15 DIAGNOSIS — H6123 Impacted cerumen, bilateral: Secondary | ICD-10-CM | POA: Diagnosis not present
